# Patient Record
Sex: Female | Born: 1975 | Race: White | Hispanic: No | Marital: Married
Health system: Southern US, Community
[De-identification: ages and names within clinical notes are randomized; demographics above are authoritative.]

---

## 2004-12-11 ENCOUNTER — Observation Stay: Payer: Self-pay

## 2004-12-28 ENCOUNTER — Inpatient Hospital Stay: Payer: Self-pay

## 2005-01-07 ENCOUNTER — Inpatient Hospital Stay: Payer: Self-pay

## 2012-01-11 ENCOUNTER — Inpatient Hospital Stay: Payer: Self-pay | Admitting: Psychiatry

## 2012-01-11 LAB — COMPREHENSIVE METABOLIC PANEL
Albumin: 3.6 g/dL (ref 3.4–5.0)
Alkaline Phosphatase: 49 U/L — ABNORMAL LOW (ref 50–136)
Anion Gap: 9 (ref 7–16)
Bilirubin,Total: 0.3 mg/dL (ref 0.2–1.0)
Calcium, Total: 8.5 mg/dL (ref 8.5–10.1)
Chloride: 105 mmol/L (ref 98–107)
Co2: 27 mmol/L (ref 21–32)
Creatinine: 0.63 mg/dL (ref 0.60–1.30)
EGFR (African American): >60
EGFR (Non-African Amer.): >60
Glucose: 96 mg/dL (ref 65–99)
Osmolality: 279 (ref 275–301)
Potassium: 3.7 mmol/L (ref 3.5–5.1)
SGOT(AST): 21 U/L (ref 15–37)

## 2012-01-11 LAB — CBC
MCHC: 34.1 g/dL (ref 32.0–36.0)
Platelet: 275 10*3/uL (ref 150–440)
RBC: 4.23 10*6/uL (ref 3.80–5.20)
RDW: 12.6 % (ref 11.5–14.5)
WBC: 7.3 10*3/uL (ref 3.6–11.0)

## 2012-01-11 LAB — ETHANOL: Ethanol %: <0.003 % (ref 0.000–0.080)

## 2012-01-11 LAB — DRUG SCREEN, URINE
Barbiturates, Ur Screen: NEGATIVE (ref ?–200)
Cannabinoid 50 Ng, Ur ~~LOC~~: NEGATIVE (ref ?–50)
Methadone, Ur Screen: NEGATIVE (ref ?–300)
Opiate, Ur Screen: NEGATIVE (ref ?–300)
Tricyclic, Ur Screen: NEGATIVE (ref ?–1000)

## 2012-01-11 LAB — TSH: Thyroid Stimulating Horm: 1.72 u[IU]/mL

## 2012-01-11 LAB — SALICYLATE LEVEL: Salicylates, Serum: 2.8 mg/dL

## 2012-01-13 LAB — FOLATE: Folic Acid: 17.3 ng/mL (ref 3.1–100.0)

## 2012-02-06 ENCOUNTER — Ambulatory Visit: Payer: Self-pay | Admitting: Obstetrics & Gynecology

## 2013-02-11 ENCOUNTER — Ambulatory Visit: Payer: Self-pay | Admitting: Obstetrics & Gynecology

## 2013-02-11 LAB — CBC
MCH: 24.8 pg — ABNORMAL LOW (ref 26.0–34.0)
MCHC: 32.1 g/dL (ref 32.0–36.0)
RBC: 4.65 10*6/uL (ref 3.80–5.20)
RDW: 16.1 % — ABNORMAL HIGH (ref 11.5–14.5)
WBC: 6.8 10*3/uL (ref 3.6–11.0)

## 2013-02-11 LAB — PREGNANCY, URINE: Pregnancy Test, Urine: NEGATIVE m[IU]/mL

## 2013-02-20 ENCOUNTER — Ambulatory Visit: Payer: Self-pay | Admitting: Obstetrics & Gynecology

## 2013-02-21 LAB — HEMOGLOBIN: HGB: 10 g/dL — ABNORMAL LOW (ref 12.0–16.0)

## 2013-02-24 LAB — PATHOLOGY REPORT

## 2013-02-26 ENCOUNTER — Inpatient Hospital Stay: Payer: Self-pay | Admitting: Obstetrics & Gynecology

## 2013-02-26 LAB — URINALYSIS, COMPLETE
Bacteria: NONE SEEN
Bilirubin,UR: NEGATIVE
Glucose,UR: NEGATIVE mg/dL (ref 0–75)
Nitrite: NEGATIVE
Protein: NEGATIVE
Specific Gravity: 1.005 (ref 1.003–1.030)
WBC UR: 5 /HPF (ref 0–5)

## 2013-02-26 LAB — COMPREHENSIVE METABOLIC PANEL
Albumin: 3.2 g/dL — ABNORMAL LOW (ref 3.4–5.0)
Alkaline Phosphatase: 80 U/L (ref 50–136)
Anion Gap: 6 — ABNORMAL LOW (ref 7–16)
BUN: 10 mg/dL (ref 7–18)
Bilirubin,Total: 0.1 mg/dL — ABNORMAL LOW (ref 0.2–1.0)
Chloride: 101 mmol/L (ref 98–107)
Creatinine: 1.38 mg/dL — ABNORMAL HIGH (ref 0.60–1.30)
Glucose: 123 mg/dL — ABNORMAL HIGH (ref 65–99)
Potassium: 4.1 mmol/L (ref 3.5–5.1)
Sodium: 136 mmol/L (ref 136–145)

## 2013-02-26 LAB — CBC WITH DIFFERENTIAL/PLATELET
Basophil #: 0.1 10*3/uL (ref 0.0–0.1)
Eosinophil #: 0 10*3/uL (ref 0.0–0.7)
Eosinophil %: 0.3 %
HCT: 36.5 % (ref 35.0–47.0)
HGB: 11.4 g/dL — ABNORMAL LOW (ref 12.0–16.0)
Lymphocyte #: 1 10*3/uL (ref 1.0–3.6)
Lymphocyte %: 7.2 %
Monocyte #: 0.5 x10 3/mm (ref 0.2–0.9)
Monocyte %: 3.3 %
Neutrophil %: 88.6 %
Platelet: 381 10*3/uL (ref 150–440)
WBC: 14.3 10*3/uL — ABNORMAL HIGH (ref 3.6–11.0)

## 2013-02-26 LAB — LIPASE, BLOOD: Lipase: 86 U/L (ref 73–393)

## 2013-02-27 LAB — CBC WITH DIFFERENTIAL/PLATELET
Basophil %: 0.7 %
Eosinophil #: 0.1 10*3/uL (ref 0.0–0.7)
Eosinophil %: 0.8 %
HCT: 30.7 % — ABNORMAL LOW (ref 35.0–47.0)
HGB: 9.4 g/dL — ABNORMAL LOW (ref 12.0–16.0)
Lymphocyte #: 1.4 10*3/uL (ref 1.0–3.6)
Lymphocyte %: 15.7 %
MCHC: 30.6 g/dL — ABNORMAL LOW (ref 32.0–36.0)
MCV: 77 fL — ABNORMAL LOW (ref 80–100)
Monocyte #: 0.9 x10 3/mm (ref 0.2–0.9)
Neutrophil #: 6.4 10*3/uL (ref 1.4–6.5)
Platelet: 317 10*3/uL (ref 150–440)
RBC: 4.01 10*6/uL (ref 3.80–5.20)
WBC: 8.8 10*3/uL (ref 3.6–11.0)

## 2013-02-28 ENCOUNTER — Ambulatory Visit: Payer: Self-pay | Admitting: Urology

## 2013-03-01 LAB — CLOSTRIDIUM DIFFICILE BY PCR

## 2013-07-22 ENCOUNTER — Ambulatory Visit: Payer: Self-pay | Admitting: Family Medicine

## 2014-06-29 IMAGING — CT CT ABD-PELV W/O CM
1 of 2 series · 14 of 32 positions shown, 18 images · non-contrast
Comparison: none

REASON FOR EXAM: (1) pt c/o abd pain severe sp surgery; (2) pain
COMMENTS:

[Series 2: 3mm soft tissue · axial · 0.81mm/px · z∈[-515,-47]mm · 14 of 172 slices shown, 18 images]
[im 8/172  soft-tissue]
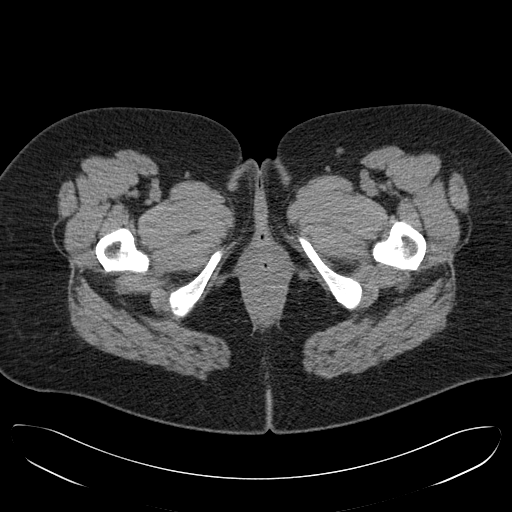
[im 8/172  bone]
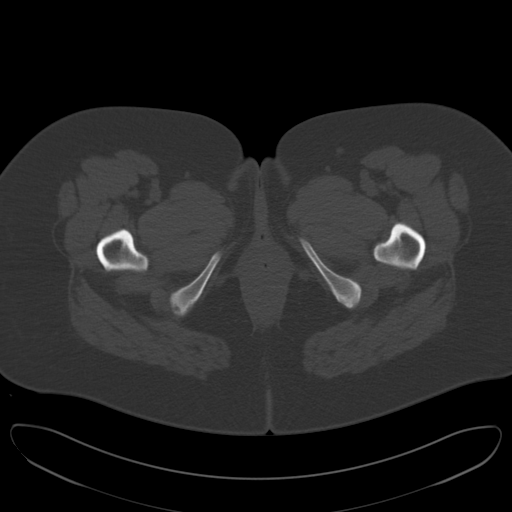
[im 22/172  soft-tissue]
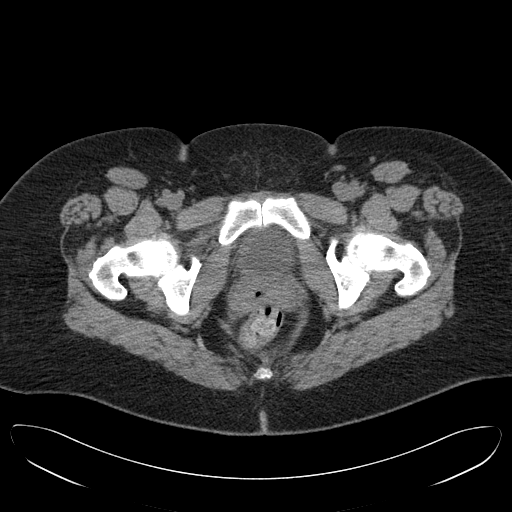
[im 36/172  soft-tissue]
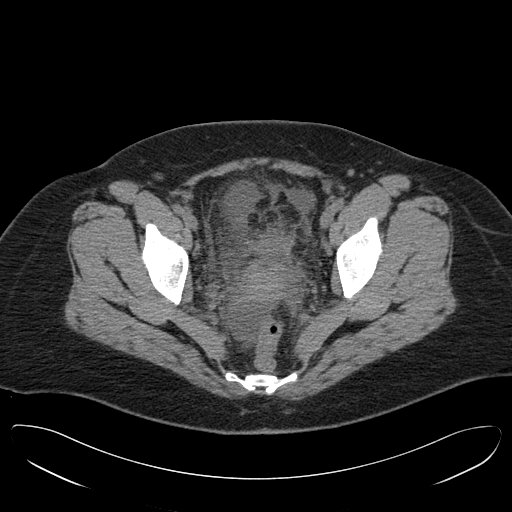
[im 50/172  soft-tissue]
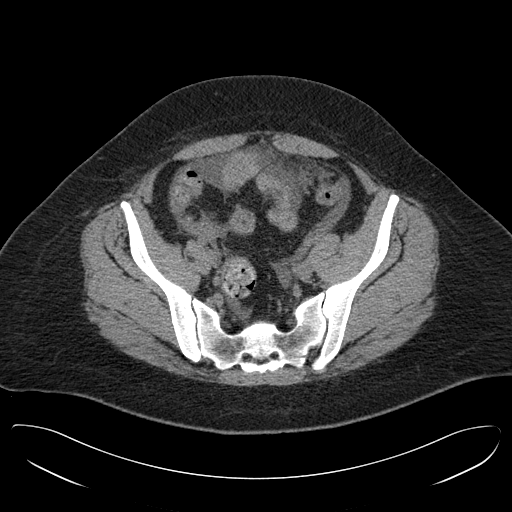
[im 65/172  soft-tissue]
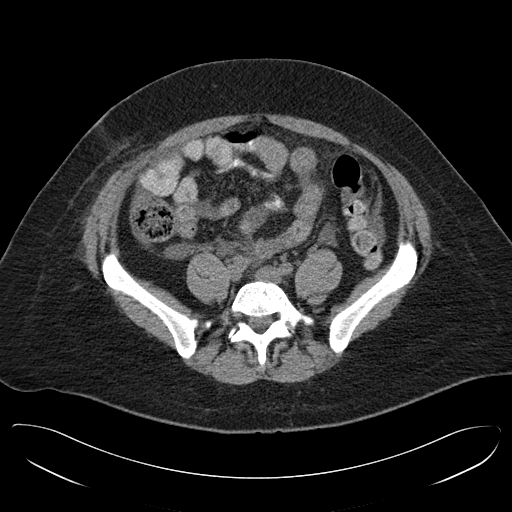
[im 79/172  soft-tissue]
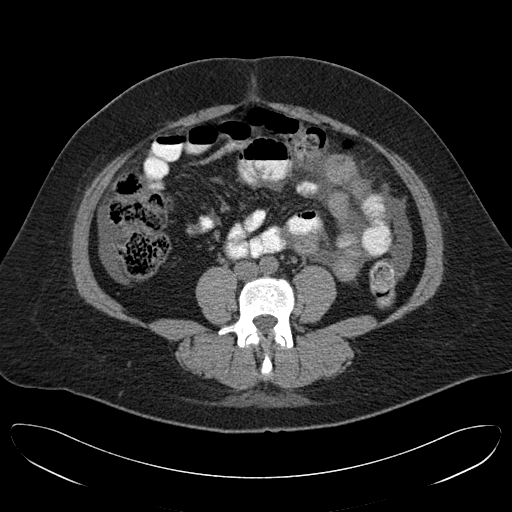
[im 93/172  soft-tissue]
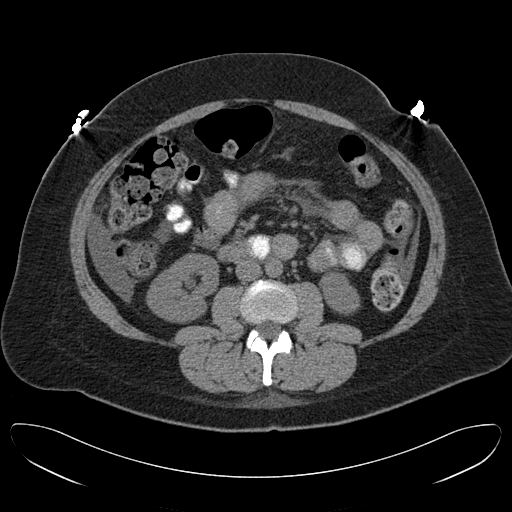
[im 107/172  soft-tissue]
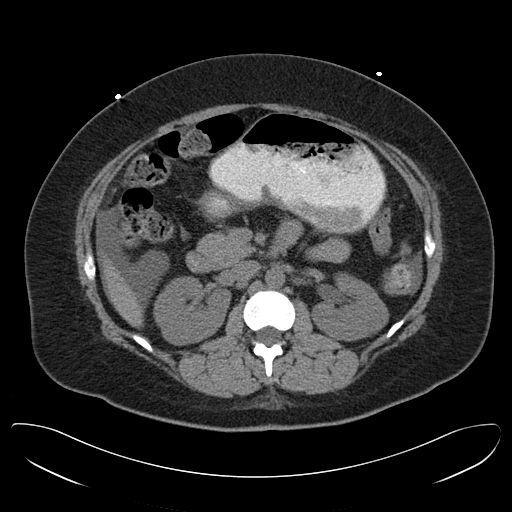
[im 122/172  soft-tissue]
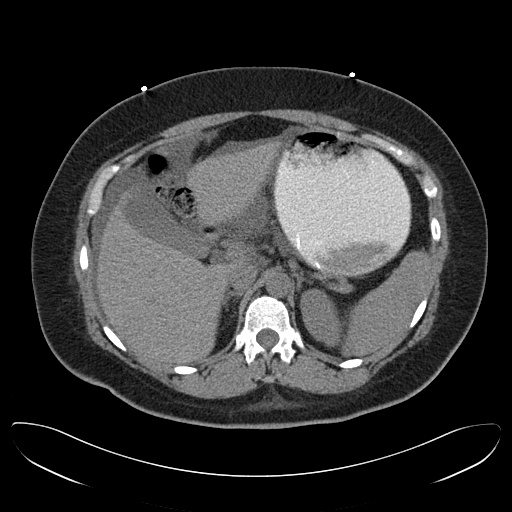
[im 122/172  bone]
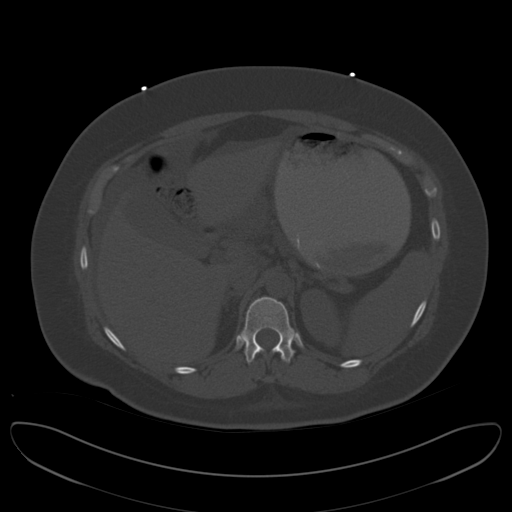
[im 136/172  soft-tissue]
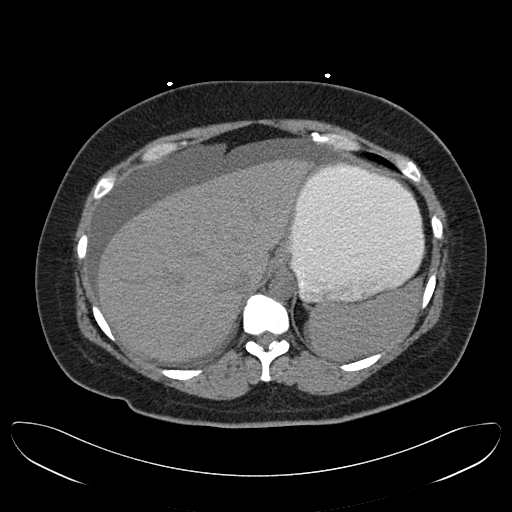
[im 143/172  lung]
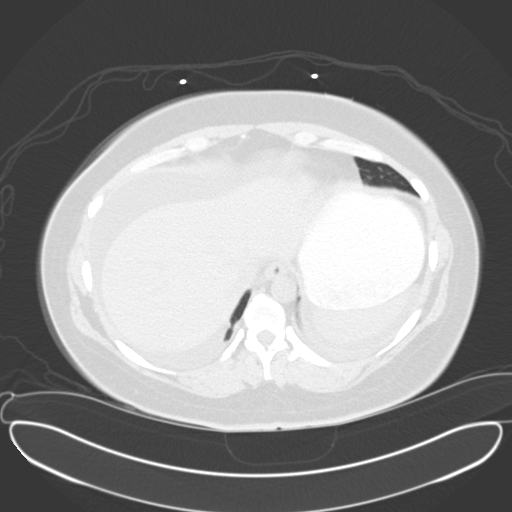
[im 150/172  soft-tissue]
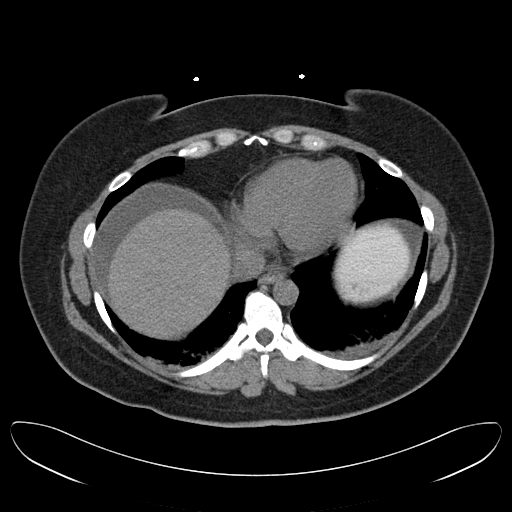
[im 150/172  lung]
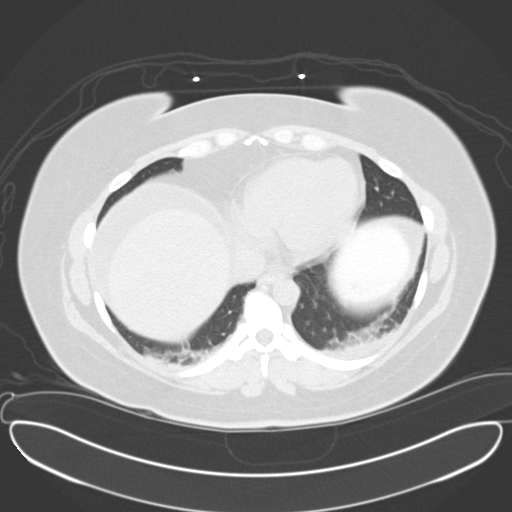
[im 157/172  lung]
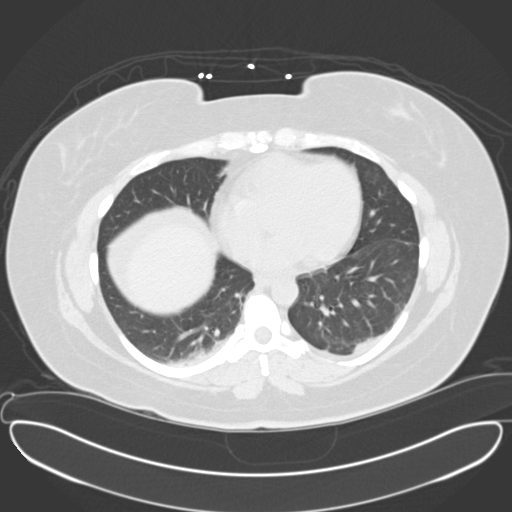
[im 164/172  soft-tissue]
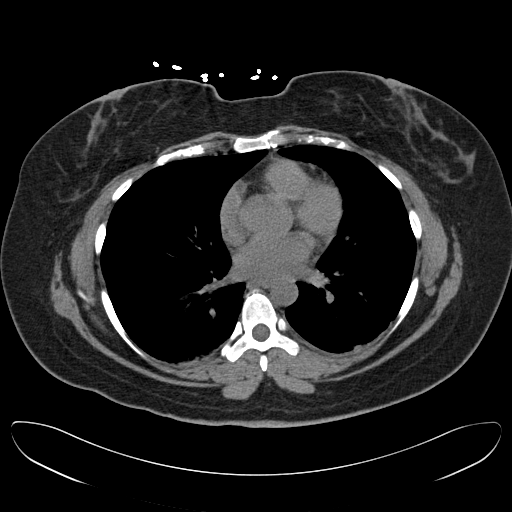
[im 164/172  lung]
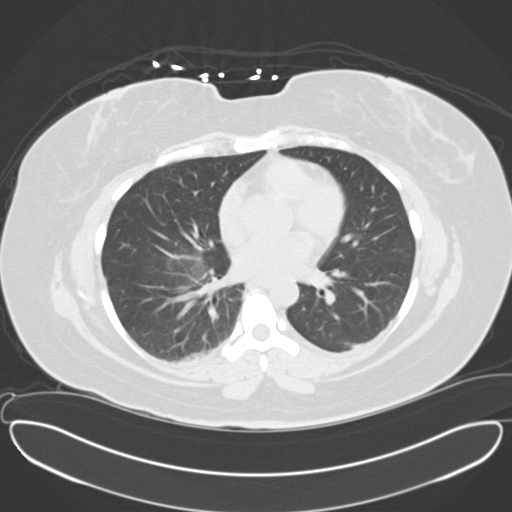

[14 of 32 positions shown; findings below may reference images not displayed]

PROCEDURE:     CT  - CT ABDOMEN AND PELVIS W[DATE]  [DATE]

RESULT:     The clinical history provided is that of pain status post
surgery. No additional clinical information is provided.

CT scanning was performed through the abdomen and pelvis following
administration of oral contrast only.

There is a large amount of ascites present. The liver is normal in density
and contour. The gallbladder is adequately distended with no evidence of
stones. The spleen, moderately distended stomach, pancreas, duodenum,
adrenal glands, and kidneys are normal in appearance.

 The caliber of the abdominal aorta is normal. There is no periaortic or
pericaval lymphadenopathy. The psoas musculature is normal in density and
contour.

The jejunum and ileum demonstrate a normal caliber with moderate amounts of
contrast and fluid present. There is a moderate amount of stool present
within the colon. No significant contrast has reached the colon. There is no
evidence of a large bowel obstruction.

The partially distended urinary bladder is normal in appearance. The uterus
exhibits no acute abnormality. There is free fluid in the cul-de-sac. There
is soft tissue fullness in the right adnexal region measuring 4 cm AP by
centimeters transversely.

There is no inguinal nor significant umbilical hernia. The lung bases reveal
atelectasis posteriorly with small pleural effusions. The lumbar vertebral
bodies are preserved in height.
IMPRESSION: 1. There is a large amount of free intra-abdominal and pelvic fluid which
exhibits Hounsfield measurement of +4. There is no evidence of free
extraluminal gas within the abdomen. There is no discrete abscess.
2. There is no acute hepatobiliary abnormality nor acute urinary tract
abnormality. demonstrated.
3. The partially contrast-filled loops of small bowel are normal in
appearance. The colon exhibits a normal appearing stool and gas pattern with
no findings to suggest colitis. The appendix is not discretely identified,
but there are no findings to suggest inflammation in the right lower
quadrant of the abdomen.
4. There are small pleural effusions layering posteriorly with bibasilar
atelectasis.

[REDACTED]

## 2014-11-22 IMAGING — US US ABDOMEN LIMITED SLG ORGAN/ASCITES
1 series · 14 of 25 positions shown · non-contrast
Comparison: none

REASON FOR EXAM: gallbladder   RT mid abd pain
COMMENTS:

PROCEDURE:     FREHIWOT - FREHIWOT ABDOMEN LTD 1 ORGAN OR QUAD  - July 22, 2013 [DATE]
RESULT:     Comparison: None
TECHNIQUE: Multiple gray-scale and color-flow Doppler images of the right
upper quadrant are presented for review.

[Series 1: us abdomen limited slg organ/ascites · 0.26mm/px · 14 of 52 slices shown]
[im 1/52]
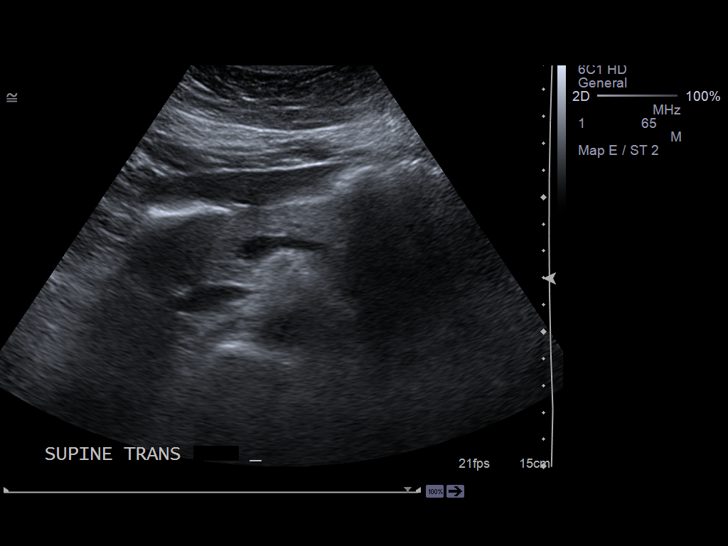
[im 5/52]
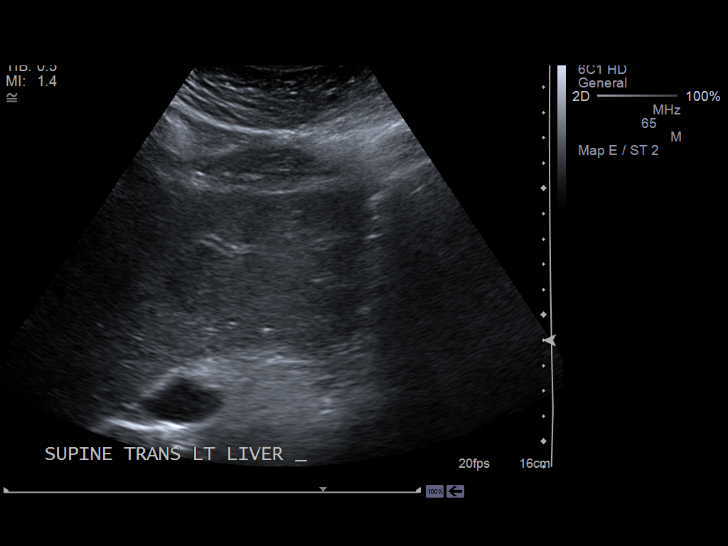
[im 9/52]
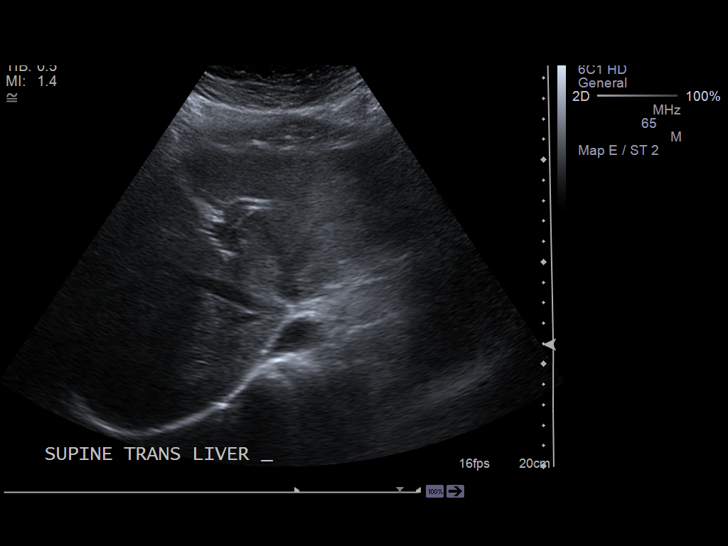
[im 13/52]
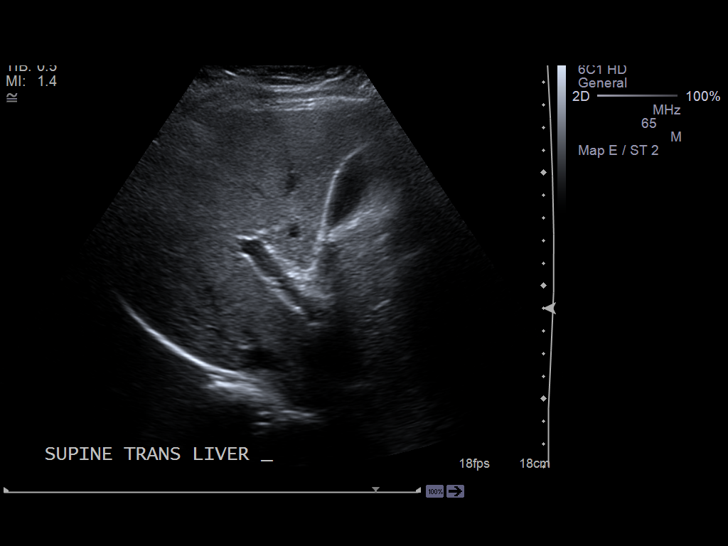
[im 18/52]
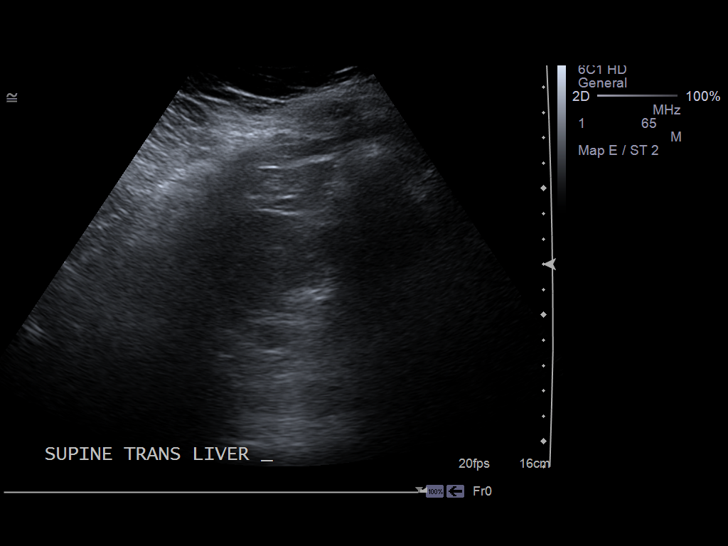
[im 20/52]
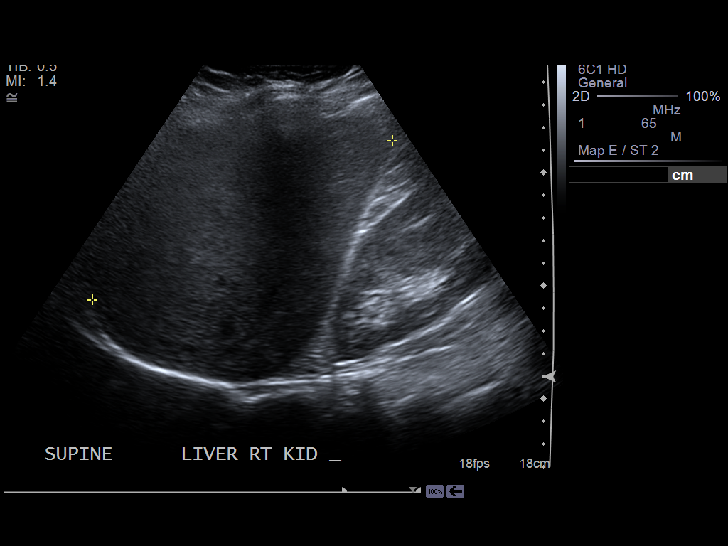
[im 24/52]
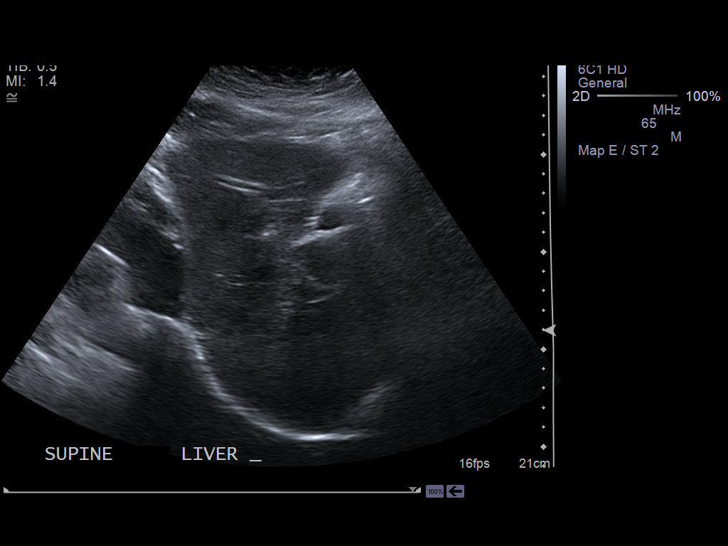
[im 28/52]
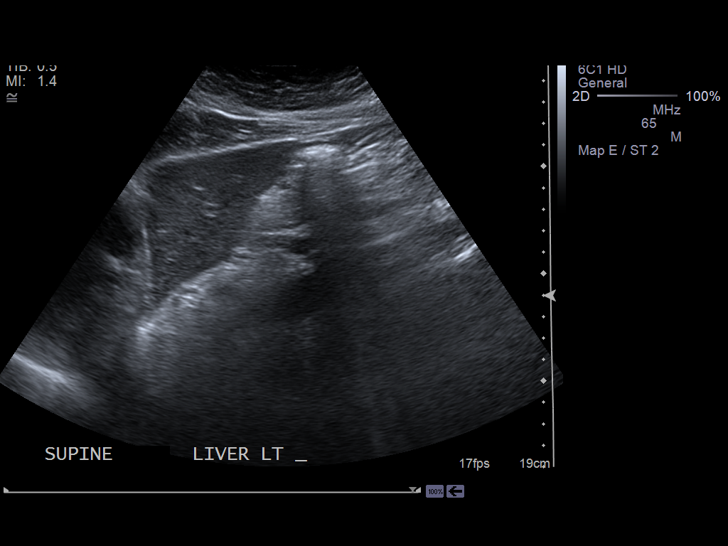
[im 32/52]
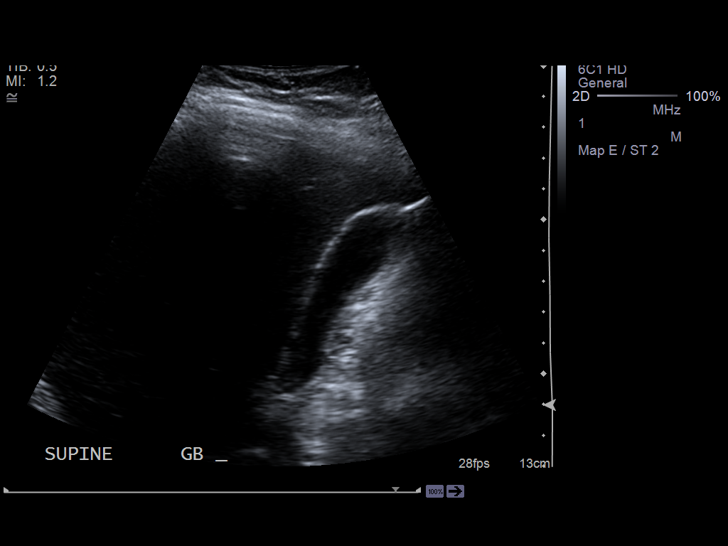
[im 35/52]
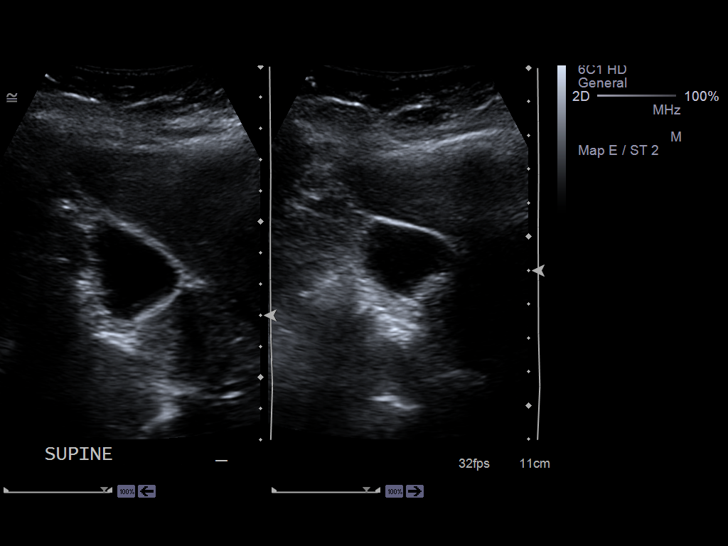
[im 39/52]
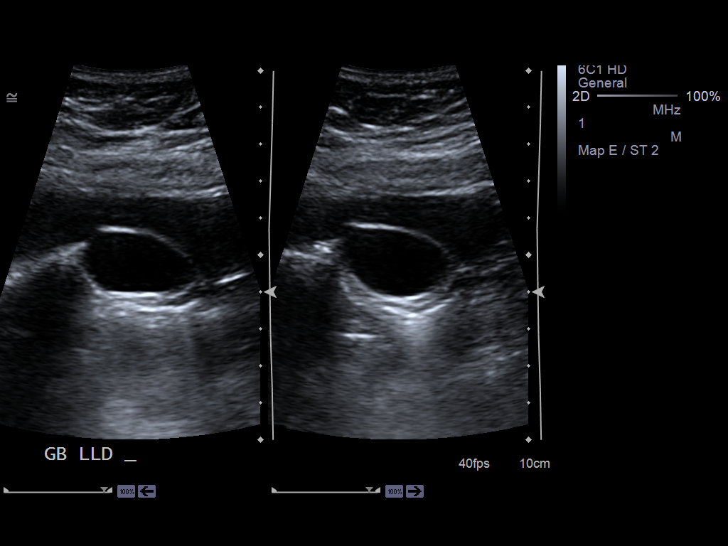
[im 43/52]
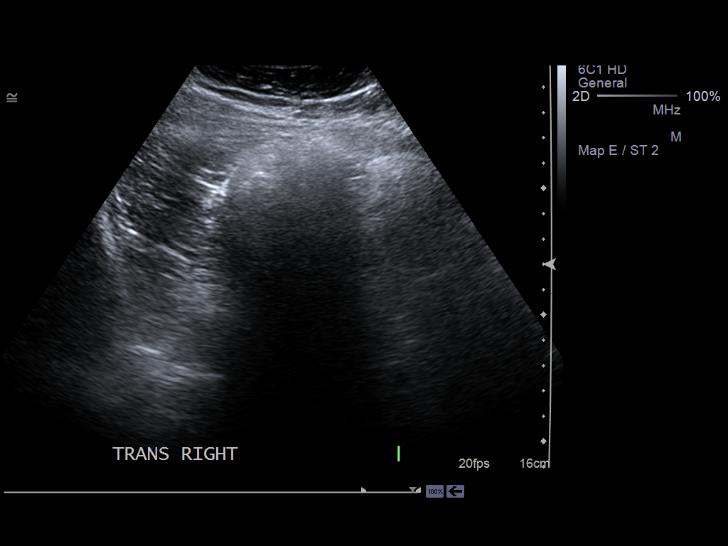
[im 47/52]
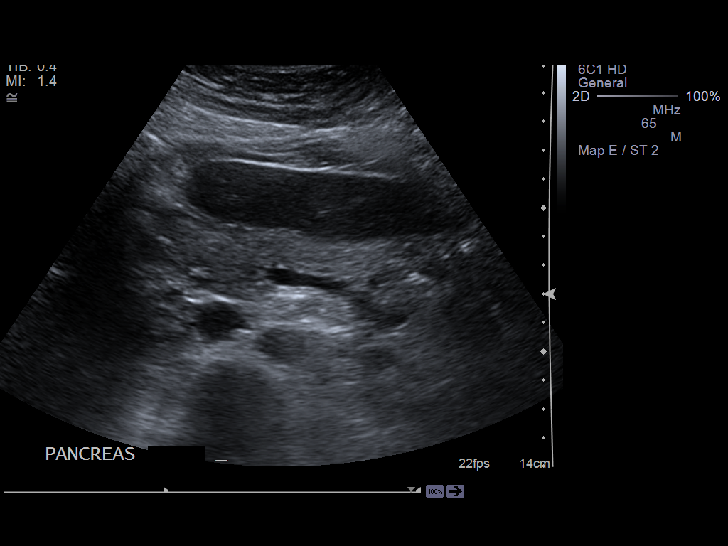
[im 52/52]
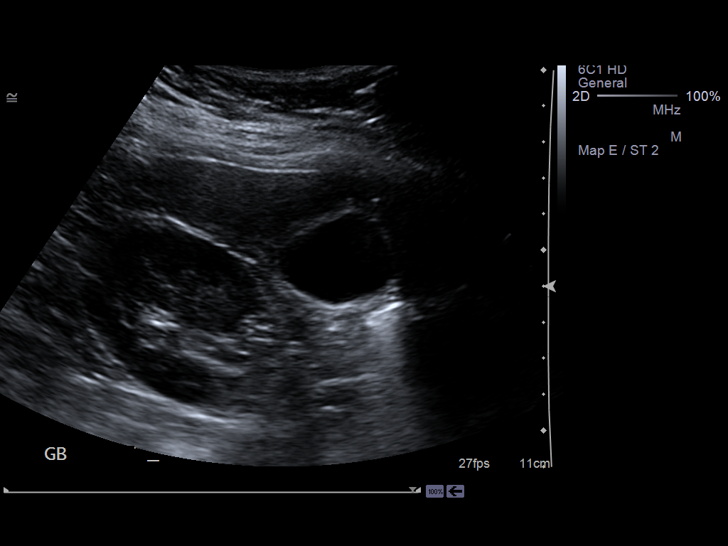

[14 of 25 positions shown; findings below may reference images not displayed]

FINDINGS: Visualized portions of the liver demonstrate normal echogenicity and normal
contours. The liver is without evidence of a focal hepatic lesion.

There is no cholelithiasis or biliary sludge. There is no intra- or
extrahepatic biliary ductal dilatation. The common duct measures 3.6 mm in
maximal diameter. There is no gallbladder wall thickening, pericholecystic
fluid, or sonographic Murphy's sign.

The visualized portion of the pancreas is normal in echogenicity. The
pancreatic tail was not visualized.
IMPRESSION: No cholelithiasis or sonographic evidence of acute cholecystitis.

[REDACTED]

## 2015-04-22 NOTE — Op Note (Signed)
PATIENT NAME:  Rhonda Archer, Rhonda Archer MR#:  [id number] DATE OF BIRTH:  [birth year]  DATE OF PROCEDURE:  02/27/2013  PREOPERATIVE DIAGNOSIS: Bladder laceration.   POSTOPERATIVE DIAGNOSIS:  Bladder laceration.   PROCEDURE: Repair of bladder laceration.   SURGEON: Anola GurneyMichael Wolff, M.D.   ASSISTANT: Dierdre Searles. Paul Harris, M.D.   ANESTHESIA: General.   INDICATIONS: See the dictated consultation. After informed consent, the patient requests the above procedure.   OPERATIVE SUMMARY: After adequate general anesthesia had been obtained, the patient was placed into modified lithotomy position using the Allen stirrups. Abdomen and perineum were prepped and draped in the usual fashion. A Foley catheter was inserted in a sterile fashion. A Pfannenstiel incision was made through prior Pfannenstiel scar. The skin was entered sharply and subcutaneous fat divided with electrocautery. Rectus fascia then was opened vertically. Peritoneum was opened and large amount of urine was suctioned out of the abdominal cavity. A Bookwalter retractor was placed. Bladder laceration was identified, on the dome of the bladder, on the left side, measuring approximately 1 cm. No other areas of injury were noted. The laceration was closed in 2 layers consisting of 4-0 Vicryl for the mucosa followed by a second layer of 4-0 Vicryl for the muscularis. After the bladder was closed, the bladder was filled with 200 cc. normal saline. No leakage or extravasation was observed. Omentum was brought down into the surgical field and pexed to the area of the laceration with 2-0 chromic. A Jackson-Pratt drain was brought out through the skin, in the right lower quadrant, and anchored to skin with 3-0 nylon suture. The incision was then closed in layers consisting of 0 Prolene for the rectus fascia followed by 2-0      chromic for the subcutaneous fat. The skin was reapproximated with staples. Sterile dressing was applied. Sponge, needle and instrument counts  were noted be correct. The procedure was then terminated and the patient was transferred to the recovery room in stable condition.  ____________________________ Suszanne ConnersMichael R. Evelene CroonWolff, MD mrw:sb D: 02/27/2013 02:19:00 ET T: 02/27/2013 07:34:16 ET JOB#: [id number]  cc: Suszanne ConnersMichael R. Evelene CroonWolff, MD, <Dictator> Orson ApeMICHAEL R WOLFF MD ELECTRONICALLY SIGNED 02/27/2013 8:33

## 2015-04-22 NOTE — Consult Note (Signed)
Chief Complaint:  Subjective/Chief Complaint Not tolerating diet. + nausea . Urine clear with good output. Minimal JP output   VITAL SIGNS/ANCILLARY NOTES: **Vital Signs.:   03-Mar-14 07:39  Vital Signs Type Routine  Temperature Temperature (F) 97.8  Celsius 36.5  Temperature Source oral  Pulse Pulse 80  Respirations Respirations 18  Systolic BP Systolic BP 123  Diastolic BP (mmHg) Diastolic BP (mmHg) 84  Mean BP 97  Pulse Ox % Pulse Ox % 95  Pulse Ox Activity Level  At rest  Oxygen Delivery Room Air/ 21 %  *Intake and Output.:   03-Mar-14 00:20  Grand Totals Intake:  1031 Output:  715    Net:  316 24 Hr.:  -408  IV (Primary)      In:  788  IV (Secondary)      In:  243  Urine ml     Out:  700  Urinary Method  Foley  JP Drain ml     Out:  15  Stool  mod amt liq- light green    00:50  Grand Totals Intake:   Output:      Net:   24 Hr.:  -408  Stool  mod amt liq light green    03:50  Grand Totals Intake:  610 Output:  435    Net:  175 24 Hr.:  -233  IV (Primary)      In:  464  IV (Secondary)      In:  146  Urine ml     Out:  425  Urinary Method  Foley  JP Drain ml     Out:  10  Stool  mod amt liq stool    04:10  Grand Totals Intake:   Output:      Net:   24 Hr.:  -233  Stool  mod amt liq green stool    04:50  Grand Totals Intake:   Output:      Net:   24 Hr.:  -233  Stool  mod amt liq green stool    05:40  Grand Totals Intake:   Output:      Net:   24 Hr.:  -233  Stool  mod amt liq green stool, incontinent w/ small amt. placed bsc at bed    05:53  [facility] Intake:  320 Output:  400    Net:  -80 24 Hr.:  -313  IV (Primary)      In:  248  IV (Secondary)      In:  72  Urine ml     Out:  400  Urinary Method  Foley    Shift 07:00  Grand Totals Intake:  1961 Output:  1550    Net:  411 24 Hr.:  -313  IV (Primary)      In:  1500  IV (Secondary)      In:  461  Urine ml     Out:  1525  JP Drain ml     Out:  25  Length of Stay Totals  Intake:  12421 Output:  16109    Net:  2011    Daily 07:00  Grand Totals Intake:  3607 Output:  3920    Net:  -313 24 Hr.:  -313  IV (Primary)      In:  2873  IV (Secondary)      In:  734  Urine ml     Out:  3875  JP Drain ml     Out:  45  Length of Stay Totals Intake:  12421 Output:  [id number]    Net:  2011   Brief Assessment:  Gastrointestinal details normal Soft   Additional Physical Exam Urine in bag is clear   Assessment/Plan:  Assessment/Plan:  Assessment Post-op bladder repair   Plan DC JP. Leg bag. Advance diet as tolerated   Electronic Signatures: Orson ApeWolff, Steffani Dionisio R (MD)  (Signed 03-Mar-14 07:48)  Authored: Chief Complaint, VITAL SIGNS/ANCILLARY NOTES, Brief Assessment, Assessment/Plan   Last Updated: 03-Mar-14 07:48 by Orson ApeWolff, Lakynn Halvorsen R (MD)

## 2015-04-22 NOTE — Consult Note (Signed)
PATIENT NAME:  Rhonda Archer, Rhonda Archer MR#:  [id number] DATE OF BIRTH:  [birth year]  DATE OF CONSULTATION:  02/26/2013  REFERRING PHYSICIAN:  Dr. Annamarie MajorPaul Harris. CONSULTING PHYSICIAN:  Suszanne ConnersMichael R. Evelene CroonWolff, MD  REASON FOR CONSULTATION:  Bladder laceration.   HISTORY OF PRESENT ILLNESS:  The patient is a 39 year old Caucasian female who presented to Dr. Luella Cookosenow earlier today for catheter removal.  After the catheter removal she developed suprapubic and abdominal pain.  Dr. Tiburcio PeaHarris contacted me and I suggested proceeding with CT and CT cystogram.  CT scan indicated a laceration in the dome of the bladder with extravasation.  Kidneys and ureters were normal without hydronephrosis or obvious injury.  No other significant abdominal pathology was seen.   ALLERGIES:  No drug allergies.   CURRENT MEDICATIONS:  Percocet, Effexor-XR and trazodone.   PAST SURGICAL HISTORY: 1.  Cesarean sections x 3, most recently 2005.  2.  Laparoscopic hysterectomy with repair of bladder laceration February 21st.   PAST AND CURRENT MEDICAL CONDITIONS:  1.  Bipolar disorder.  2.  Anxiety disorder.  SOCIAL HISTORY:  She smokes about 1/2 pack a day and has a 15 pack-year history.  She denied alcohol use.   FAMILY HISTORY:  Negative for renal disease.   REVIEW OF SYSTEMS:   The patient denied kidney stones, urinary tract infections, history of hematuria or urinary incontinence.   PHYSICAL EXAMINATION: ABDOMEN:  Soft, slightly tender in the suprapubic region.   PERTINENT LABORATORY STUDIES:  BUN of 10 and a creatinine of 1.38.  White cell count was 14,300 and hematocrit was 36.5%.    CT scan indicated leakage of urine from laceration in the posterior dome of the bladder.  Kidneys and ureters were normal.   IMPRESSION:  Bladder laceration.   PLAN:  Exploratory laparotomy with repair of bladder laceration.  Procedure, risks, benefits, alternatives discussed with patient.  She seemed to understand and would like to proceed.        ____________________________ Suszanne ConnersMichael R. Evelene CroonWolff, MD mrw:ea D: 02/26/2013 23:14:18 ET T: 02/26/2013 23:58:22 ET JOB#: [id number]  cc: Suszanne ConnersMichael R. Evelene CroonWolff, MD, <Dictator> Orson ApeMICHAEL R WOLFF MD ELECTRONICALLY SIGNED 02/27/2013 8:30

## 2015-04-22 NOTE — Consult Note (Signed)
Brief Consult Note: Diagnosis: Diarrhea.   Patient was seen by consultant.   Consult note dictated.   Comments: Patient seen. Full consult dictated.  Electronic Signatures: Lurline DelIftikhar, Shaukat (MD)  (Signed 03-Mar-14 12:02)  Authored: Brief Consult Note   Last Updated: 03-Mar-14 12:02 by Lurline DelIftikhar, Shaukat (MD)

## 2015-04-22 NOTE — Consult Note (Signed)
Chief Complaint:  Subjective/Chief Complaint POD 2 multiple episodes of emesis no complaints pain controlled   VITAL SIGNS/ANCILLARY NOTES: **Vital Signs.:   02-Mar-14 03:42  Vital Signs Type Routine  Temperature Temperature (F) 98.2  Celsius 36.7  Temperature Source oral  Pulse Pulse 86  Respirations Respirations 20  Systolic BP Systolic BP 127  Diastolic BP (mmHg) Diastolic BP (mmHg) 84  Mean BP 98  Pulse Ox % Pulse Ox % 96  Pulse Ox Activity Level  At rest  Oxygen Delivery Room Air/ 21 %  *Intake and Output.:   Daily 02-Mar-14 07:00  Grand Totals Intake:  2362 Output:  2635    Net:  -273 24 Hr.:  -273  IV (Primary)      In:  2362  Urine ml     Out:  1950  Emesis ml     Out:  425  JP Drain ml     Out:  260  Length of Stay Totals Intake:  8189 Output:  6475    Net:  1714   Brief Assessment:  Cardiac Regular   Respiratory normal resp effort   Gastrointestinal Normal   Gastrointestinal details normal Soft  No rebound tenderness  approp. tender   Additional Physical Exam inc c/d/i  JP- serous  foley- clear   Lab Results: Routine Chem:  27-Feb-14 17:00   Creatinine (comp)  1.38   Assessment/Plan:  Assessment/Plan:  Assessment S/P  Repair of bladder injury, stable   Plan - cont. plan - JP output remains high, JP Cr. check pending, cont. JP for now - cont. foley per plan through D/C - Diet per primary team   Electronic Signatures: Charyl BiggerHouser, Edward Ross (MD)  (Signed 02-Mar-14 06:19)  Authored: Chief Complaint, VITAL SIGNS/ANCILLARY NOTES, Brief Assessment, Lab Results, Assessment/Plan   Last Updated: 02-Mar-14 06:19 by Charyl BiggerHouser, Edward Ross (MD)

## 2015-04-22 NOTE — Op Note (Signed)
  Orson ApeMICHAEL R Terion Hedman MD ELECTRONICALLY SIGNED 02/27/2013 8:34

## 2015-04-22 NOTE — Consult Note (Signed)
PATIENT NAME:  Rhonda BignessMCLENDON, Verlisa E MR#:  [id number] DATE OF BIRTH:  1976-02-13  DATE OF CONSULTATION:  03/02/2013  REFERRING PHYSICIAN:  Velora Mediateobert Harris, MD CONSULTING PHYSICIAN:  Lurline DelShaukat Iftikhar, MD  REASON FOR CONSULTATION: Diarrhea.   HISTORY OF PRESENT ILLNESS: This is a 39 year old female without any significant history of any GI problems. The patient recently underwent laparoscopic supracervical hysterectomy. The patient had a Foley catheter at that point. Later on she underwent second surgery with bladder repair. JP drain was removed today. Starting yesterday morning around 4:00 the patient started to have profuse diarrhea. She had numerous bowel movements yesterday, which were reported as watery. She denies any significant abdominal cramping, nausea, vomiting, fever or chills. Consultation was placed by Dr. Tiburcio PeaHarris this morning and the patient was evaluated a few minutes ago. Apparently she wanted to leave AMA and therefore Dr. Tiburcio PeaHarris is in the process of discharging her home. According to her, she received 1 dose of Imodium today and since then she has had only 1 bowel movement, which was somewhat less watery. She denies any blood in the stool. Continues to deny any abdominal pain, fever, chills, nausea or vomiting. The patient has no prior history of chronic diarrhea, etc.   ALLERGIES:  No known drug allergies.   MEDICATIONS:  She denies using any antibiotics lately.  SOCIAL HISTORY AND FAMILY HISTORY: Grossly unremarkable.   REVIEW OF SYSTEMS: Negative except for what is mentioned in the history of present illness.  PHYSICAL EXAMINATION: GENERAL: The patient does not appear to be in any acute distress. She does not appear to be toxic or septic.  VITAL SIGNS: She is afebrile and vitals are fairly stable.  SKIN: Unremarkable. No jaundice was noted.  LUNGS: Grossly clear to auscultation bilaterally with fair air entry and no added sounds.  CARDIOVASCULAR: Regular rate and rhythm. No gallops  or murmur.  ABDOMEN: Shows mildly distended abdomen. She is complaining of mild diffuse tenderness. Bowel sounds are normal. There is no rebound or guarding and no organomegaly was noted.  NEUROLOGIC: Grossly appears to be intact.   LABS: White cell count 8.8, hemoglobin 9.4, hematocrit 30.7 and MCV 77. Stool for C. diff toxin is negative.   ASSESSMENT AND PLAN: The patient is with acute onset diarrhea without fever or bleeding. This may be related to some of the medicine she has received recently versus a viral gastroenteritis. Her physical examination is quite benign. She is afebrile and has no leukocytosis. Her diarrhea seems to have responded to just 1 tablet of Imodium. CT scan did not show any gross intestinal pathology. The patient is adamant about going home and is currently being discharged. I have discussed with the patient that she can continue to take Imodium once or twice a day. She should return to the Emergency Room if the diarrhea becomes bloody or should she develop significant abdominal pain, fever or vomiting. If the diarrhea is not resolved in 48 hours, the patient has been instructed to follow up with her primary care physician. ____________________________ Lurline DelShaukat Iftikhar, MD si:sb D: 03/02/2013 12:01:22 ET T: 03/02/2013 12:13:50 ET JOB#: [id number]  cc: Lurline DelShaukat Iftikhar, MD, <Dictator> Lurline DelSHAUKAT IFTIKHAR MD ELECTRONICALLY SIGNED 03/02/2013 17:05

## 2015-04-22 NOTE — Op Note (Signed)
PATIENT NAME:  Rhonda Archer, Rhonda Archer MR#:  [id number] DATE OF BIRTH:  04/14/1976  DATE OF PROCEDURE:  02/20/2013  PREOPERATIVE DIAGNOSIS: Pelvic pain.   POSTOPERATIVE DIAGNOSIS: Pelvic pain with endometriosis.   PROCEDURE PERFORMED: Laparoscopic supracervical hysterectomy and repair of cystotomy.   SURGEON: Dierdre Searles. Paul Boluwatife Flight, M.D.   ASSISTANJean Rosenthal: Jackson, M.D.   ANESTHESIA: General.   ESTIMATED BLOOD LOSS: 50 mL.   COMPLICATIONS: Dome of bladder cystotomy.   FINDINGS: The patient had evidence of endometriosis particularly in the anterior peritoneum and the tissue surrounding the lower uterine segment and bladder. The ovaries were normal.   DISPOSITION: To the recovery room in stable condition.   TECHNIQUE: The patient is prepped and draped in the usual sterile fashion after adequate anesthesia is obtained in the dorsal lithotomy position. A Foley catheter is inserted. A sponge stick is placed per vagina for manipulation purposes.   Attention is then turned to the abdomen where a Veress needle is inserted through a 5-mm infraumbilical incision after Marcaine is used to anesthetize the skin. Veress needle placement is confirmed using the hanging drop technique and the abdomen is then insufflated with CO2 gas. A 5-mm trocar is then inserted under direct visualization with the laparoscope with no injuries or bleeding noted. The patient is placed in Trendelenburg positioning and the above-mentioned findings are visualized.   An 11-mm trocar is placed in the right lower quadrant and a 5-mm trocar is placed in the left lower quadrant lateral to the inferior epigastric blood vessels with no injuries or bleeding noted. The uterus is grasped with a tenaculum and the utero-ovarian blood vessels and ligaments are carefully clamped, transected, and ligated using the 5-mm Harmonic scalpel device with preservation of the ovary and its main blood supply. Dissection is carried down to the level of the uterine arteries  on both sides. On the left side there is a significant amount of adhesions and endometriosis and is dissected carefully; however with the dissection, a cystotomy is revealed at the dome of the bladder. A camera is inserted into the bladder with visualization of the Foley bulb and there is no apparent injury in the trigone area of the bladder. The bladder cystotomy is approximately 1 cm or less in size. Using the EndoStitch device with Polysorb suture the cystotomy is closed with 2 sutures applied and then a third suture applied to imbricate the incision area. At this time 200 mL of methylene blue saline is injected into the bladder through the Foley catheter with no spillage of dye around the area of the cystotomy.   The uterine arteries are carefully coagulated and cut and the uterus appears to be blanch. The uterus is amputated at the level of the cervix with no further areas of concern around the bladder or ureters. The endocervical canal is cauterized.   A morcellator device is placed through the right lower quadrant and the uterus is removed by morcellation process. The pelvic cavity is irrigated with aspiration of all fluid and excellent hemostasis noted. Interceed is placed over the cervical stump to minimize adhesion formation. The right lower quadrant incision is closed with a 0 Vicryl suture using a fascial closure device after removal of the trocar and morcellator device. The gas is expelled. The remaining trocars are removed. The skin is closed with Dermabond at all sites. The Foley catheter is left in place and the sponge stick is removed, and the patient goes to the recovery room in stable condition. All sponge, instrument, and needle counts  are correct.  ____________________________ R. Annamarie Major, MD rph:jm D: 02/20/2013 16:42:27 ET T: 02/21/2013 09:55:53 ET JOB#: [id number]  cc: Dierdre Searles, MD, <Dictator> Nadara Mustard MD ELECTRONICALLY SIGNED 02/23/2013 22:07

## 2015-04-24 NOTE — Consult Note (Signed)
   Psychological Assessment  Joyce GrossJade McLendon36of Evaluation: 1-13-13Administered: [facility] Multiphasic Personality Inventory-2 (MMPI-2) for Referral: [name] was referred for a psychological assessment by her physician, Caryn SectionAarti Kapur.  She was admitted to Behavioral Medicine for treatment of increasing depression with suicidal ideation. Please see the history and physical and psychosocial history for further background information. An assessment of personality structure was requested. Ms. Dauphin?s MMPI-2 protocol is compared to that of other adult females she obtained the following profile: [id number]. The MMPI-2 validity scales indicate that the clinical profile is valid. However, she has described herself in a very self-favorable manner and may have minimized the distress she experiences. PresentationShe reports that she is experiencing no emotional distress. She is usually calm and not easily upset and is happy most of the time. Her daily life is full of things that keep her interested. She reports that her attention, concentration and memory seem to be all right. She is not self-confident. She believes her judgment is better than it ever has been. She is not troubled by recurring thoughts. Relations: She reports that she relates easily to others and makes a good impression on them. She enjoys social gatherings and parties and the excitement of a crowd.  Problem Areas: She reports that she is in good physical health. She sleeps very well and wakes up fresh and rested most mornings. She usually has enough energy to do her work and is about as able to work as she ever was.  Her prognosis is guarded because she is experiencing little emotional distress, limiting her motivation to engage in any type of therapeutic intervention. Short-term therapies focusing on the behaviors that led her to treatment will be the most beneficial. Impression:Adjustment Disorder   Electronic Signatures: Carola Frostoush,  Lee Ann (PsyD, HSP-P)  (Signed on 14-Jan-13 13:30)  Authored  Last Updated: 14-Jan-13 13:30 by Carola Frostoush, Lee Ann (PsyD, HSP-P)

## 2015-04-24 NOTE — H&P (Signed)
PATIENT NAME:  Rhonda Archer, Rhonda Archer MR#:  [id number] DATE OF BIRTH:  04-02-1976  DATE OF ADMISSION:  01/11/2012  REFERRING PHYSICIAN: Janalyn Harderavid Kaminski, MD  ADMITTING PHYSICIAN: Rhonda SectionAarti Karmah Potocki, MD  REASON FOR ADMISSION: Suicidal thoughts.   IDENTIFYING INFORMATION: Ms. Rhonda Archer is a 39 year old married Caucasian female currently living in the [place] area with her husband and three children. The patient is currently unemployed for the past 2-1/2 years. Her husband is a Public relations account executivecorrectional officer.   HISTORY OF PRESENT ILLNESS: Ms. Rhonda Archer is a 39 year old married Caucasian female with a one year history of worsening depressive symptoms who was referred to the emergency room by this writer after she started to express suicidal thoughts with a plan to cut her wrist during an outpatient psychotropic medication management office visit. The says that the 10 year anniversary of her mother's death is approaching on 02/07 of this year. Her mother died in 2003 of lung cancer. The patient was unable to be with her mother who died alone in [place]. The patient did see a psychiatrist, Rhonda Archer, in 2005 and had a brief trial of Wellbutrin with no significant improvement. In addition she failed trials of Prozac and Zoloft as well. The patient and has had some improvement on Effexor and mood was improving, but then approximately one week ago she started to have problems with suicidal thoughts, feelings of hopelessness and worthlessness, as well as decreased energy level. She says she has not been sleeping well and has been waking up 3 to 4 times during the night. The patient is currently taking trazodone 50 mg nightly. The patient says that the holidays were a good distraction for her and she was able to focus on her children, but now that the holidays have passed she has been more depressed. The patient specifically described a plan of getting a scalpel and slicing her wrist. She said that other plans were "too messy". She denies  any change in appetite, weight gain, or weight loss. The patient has been having some marital conflict as well and has difficulty with physical intimacy with her husband. She says that in order to have sex with him more recently she has been having to use medication like Xanax in order to relax enough to have sex. The patient denies any history of any prior sexual abuse. Of note the patient has been taking Fen/Phen prescribed to her by her outpatient gynecologist to help lose weight. The patient denies any history of any psychotic symptoms including auditory or visual hallucinations. She denies any paranoid thoughts or delusions. The patient has had problems with mood instability, impulsive behaviors. She will impulsively separate and leave her husband on multiple occasions in the past. She does have problems with mood lability including mood swings within the daytime, periods of irritability and anxiety. The patient denies any grandiose delusions, hyperreligious thoughts, or hypersexual behavior.   PAST PSYCHIATRIC HISTORY: The patient denies any prior inpatient psychiatric hospitalizations. She did have one possible suicide attempt in which she overdosed on a bottle of Ambien, approximately one year ago. The patient did not go to a hospital or seek treatment at that time. She did see Rhonda Archer in 2005 for 2 to 3 visits, but did not have a good relationship with the psychiatrist and terminated her services. She has failed trials of Prozac, Zoloft, and Wellbutrin but did have a positive response to Pristiq in the past. She was unable to afford the medication. She is currently on Effexor-XR 150 mg p.o. daily,  just increased from 75 mg p.o. daily a few weeks ago. She is also on Trazodone 50 mg p.o. at bedtime.   FAMILY PSYCHIATRIC HISTORY: The patient reports that her mother struggled with depression, but was not formally diagnosed.   PAST MEDICAL HISTORY: She has history of three Cesarean sections. She denies  any history of any prior TBI or seizures.   OUTPATIENT MEDICATIONS:  1. Effexor-XR 150 mg p.o. daily. 2. Trazodone 50 mg p.o. nightly.  SUBSTANCE ABUSE HISTORY: The patient says she tried marijuana and uses marijuana about once every 5 or 6 years. She denies any history of any heavy alcohol use or illicit drug use. The patient denies any history of any cocaine, opiate, or stimulant use. She does smoke 1/2 pack of cigarettes per day on a daily basis and has been smoking since the age of 21. She tried Wellbutrin in the past to help quit smoking but it did not work.   SOCIAL HISTORY: The patient was born in [place] and raised in [place] by her mother primarily as her parents divorced when she was [age] years old. She has one brother. She graduated high school and was in the [employer] for 2 to 3 years. She then left the [employer] after getting pregnant. She has an honorable discharge. She denies any history of any combat experience. She is married now to her second husband for the past 13 years and has three children, ages 63, 53 and 79. The patient and her husband live in the [place] area. She says her first marriage was very brief and only lasted a year.  She says that she married right out of high school and was very "immature".  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 Her husband is a Public relations account executive. She denies any history of any physical or sexual abuse.   LEGAL HISTORY: She does have a history of shoplifting as a juvenile.   MENTAL STATUS EXAM: Rhonda Archer is a 39 year old obese Caucasian female with medium shoulder length brown hair. She was wearing a tank top sundress. She was of average height. She was fully alert and oriented to time, place, and situation. Speech was regular rate and rhythm, fluent and coherent. Mood was depressed and anxious and affect was tearful and crying. At times the patient was  crying hysterically. She does endorse some passive suicidal thoughts and has a plan to cut her wrist with a scalpel. When asked if she was able to contract for safety the patient said "I don't think I will act on these thoughts, but I'm not sure". She denies any history of any auditory or visual hallucinations. No paranoid thoughts or delusions. No homicidal thoughts. Thought processes were linear, logical, and goal-directed. Attention and concentration are fair. The patient was crying hysterically and not wanting to answer questions, with regards to memory and recall. Cognition is grossly intact.   SUICIDE RISK ASSESSMENT: Due to history of impulsivity, the patient is at moderately elevated risk of harm to self and others. In addition she is currently still grieving for the loss of her mother and the 12 year anniversary of her mother's death is approaching in a few weeks.  REVIEW OF SYSTEMS: CONSTITUTIONAL: The patient denies any weakness but does complain of some fatigue. She denies any weight changes. She denies any fever, chills, or night sweats. HEENT: She denies any headaches, dizziness, change in  vision, difficulty hearing, vertigo, or neck pain. She denies any diplopia or blurred vision. RESPIRATORY: She denies any shortness of breath or cough. CARDIOVASCULAR: She denies any chest pain or orthopnea. GASTROINTESTINAL: She denies any nausea, vomiting, or abdominal pain. She denies any change in bowel movements. GENITOURINARY: She denies incontinence or problems with frequency of urine. ENDOCRINE: She denies any heat or cold intolerance. LYMPHATIC: She denies any anemia or easy bruising. MUSCULOSKELETAL: She denies any muscle or joint pain. NEUROLOGICAL: She denies any tingling or weakness. Gait is normal and steady. PSYCHIATRIC: Please see history of present illness.   PHYSICAL EXAMINATION:   VITAL SIGNS: Blood pressure 149/94, heart rate 71, respirations 18, and temperature 97.5.   HEENT:  Normocephalic, atraumatic. Pupils are equal, round, and reactive to light and accommodation. Extraocular movements are intact. Oral mucosa was moist. No lesions noted.   NECK: Supple. No cervical lymphadenopathy or thyromegaly present.   LUNGS: Clear to auscultation bilaterally. No crackles, rales, or rhonchi.   HEART: S1 and S2 present. Regular rate and rhythm. No murmurs, rubs, or gallops.   ABDOMEN: Soft. Normoactive bowel sounds present in all four quadrants. No tenderness noted. No distention.   EXTREMITIES: +2 pedal pulses bilaterally. The patient did have a tattoo on her right upper extremity. No rashes, cyanosis, clubbing, or edema.   NEUROLOGIC: Cranial nerves II through XII are grossly intact. No hypo or hyperreflexia. Sensation intact.  LABS: BUN 6, creatinine 0.63, sodium 141, potassium 3.7, chloride 105, CO2 27, glucose 96, alkaline phosphatase 49, AST 21, and ALT 19. TSH 1.72. CBC within normal limits. Acetaminophen and salicylate levels were unremarkable. Pregnancy test was negative. Toxicology screen was positive for amphetamines. Ethanol is less than 3.  DIAGNOSES:   AXIS I:  1. Mood disorder not otherwise specified, rule out bipolar disorder, type II.  2. Nicotine dependence.   AXIS II: Deferred.   AXIS III: No major medical conditions.   AXIS IV: Mild to moderate - marital conflict, death of her mother in May 24, 2002.   AXIS V: Global assessment of functioning score of 25 to 30.   ASSESSMENT AND TREATMENT RECOMMENDATIONS: Ms. Neer is a 39 year old married Caucasian female with a history of recurrent depression as well as some impulsive behaviors more reflective of possible bipolar disorder type II who presented to an outpatient psychiatrist endorsing passive suicidal thoughts with the plan to get a scalpel and cut her wrist. She was unable to fully contract for safety outside of the hospital. We will admit to inpatient psychiatry for medication management, safety, and  stabilization as the patient does need to be started on a mood stabilizer.  1. Bipolar disorder, type II: We will plan to start Lamictal 25 mg p.o. daily with the plan to titrate up as tolerated. We will continue Effexor-XR at 150 mg p.o. daily for anxiety and depression. We will check B12 and folate level. We will increase trazodone to 100 mg p.o. nightly to help with insomnia as the patient has not been sleeping well.  2. We will get MMPI to rule out borderline personality disorder.  3. Disposition: The patient has a stable living situation with her husband. Her husband has been notified of her admission and is supportive of treatment. Mental health follow-up will be with Dr. Maryruth Bun or a community psychiatrist as the patient wishes.   TIME SPENT: 80 minutes ____________________________ Doralee Albino. Maryruth Bun, MD akk:[id number] D: 01/12/2012 09:02:48 ET T: 01/12/2012 09:39:08 ET JOB#: [id number]  cc: Yashica Sterbenz K. Maryruth Bun, MD, <Dictator> Claretha Townshend K  Maryruth Bun MD ELECTRONICALLY SIGNED 01/13/2012 20:34

## 2015-05-10 NOTE — H&P (Signed)
L&D Evaluation:  History Expanded:  HPI 39 yo [id number] who underwent a LSH on 2/21.  Pt had an inadvertant cyctotomy which was repaired.  Pt seen in the office today, relatively unveventful, the catheter was removed.  Pt presents tonight with severe diffuse abdominal pain.   Presents with abdominal pain   Patient's Medical History No Chronic Illness   Patient's Surgical History CS x 3, LSH   Medications Percocet   Allergies NKDA   Social History tobacco   Exam:  Vital Signs T = 97, P = 867, Bp = 122/64   General acutely in distress   Mental Status clear   Chest clear   Heart normal sinus rhythm   Abdomen markedly tender with guarding, BS present   Other WBC = 14.3 Creatinine = 1.3   Impression:  Impression PO Complication, ?bowel but most likely urological.   Plan:  Comments Will get general surgical and urological consultation   Electronic Signatures: Towana Badgerosenow, Philip J (MD)  (Signed 27-Feb-14 20:22)  Authored: L&D Evaluation   Last Updated: 27-Feb-14 20:22 by Towana Badgerosenow, Philip J (MD)
